# Patient Record
Sex: Female | Born: 1935 | Race: Black or African American | Hispanic: No | Smoking: Never smoker
Health system: Southern US, Community
[De-identification: ages and names within clinical notes are randomized; demographics above are authoritative.]

## PROBLEM LIST (undated history)

## (undated) DIAGNOSIS — I10 Essential (primary) hypertension: Secondary | ICD-10-CM

## (undated) DIAGNOSIS — M1990 Unspecified osteoarthritis, unspecified site: Secondary | ICD-10-CM

## (undated) DIAGNOSIS — I509 Heart failure, unspecified: Secondary | ICD-10-CM

## (undated) HISTORY — PX: TUBAL LIGATION: SHX77

---

## 2015-11-01 ENCOUNTER — Emergency Department (HOSPITAL_COMMUNITY): Payer: Medicare Other

## 2015-11-01 ENCOUNTER — Encounter (HOSPITAL_COMMUNITY): Payer: Self-pay

## 2015-11-01 ENCOUNTER — Emergency Department (HOSPITAL_COMMUNITY)
Admission: EM | Admit: 2015-11-01 | Discharge: 2015-11-01 | Disposition: A | Discharge status: Home / Self Care | Payer: Medicare Other | Attending: Emergency Medicine | Admitting: Emergency Medicine

## 2015-11-01 DIAGNOSIS — Z791 Long term (current) use of non-steroidal anti-inflammatories (NSAID): Secondary | ICD-10-CM | POA: Diagnosis not present

## 2015-11-01 DIAGNOSIS — Y9289 Other specified places as the place of occurrence of the external cause: Secondary | ICD-10-CM | POA: Insufficient documentation

## 2015-11-01 DIAGNOSIS — M1000 Idiopathic gout, unspecified site: Secondary | ICD-10-CM | POA: Diagnosis not present

## 2015-11-01 DIAGNOSIS — Y9389 Activity, other specified: Secondary | ICD-10-CM | POA: Insufficient documentation

## 2015-11-01 DIAGNOSIS — Z880 Allergy status to penicillin: Secondary | ICD-10-CM | POA: Insufficient documentation

## 2015-11-01 DIAGNOSIS — I10 Essential (primary) hypertension: Secondary | ICD-10-CM | POA: Insufficient documentation

## 2015-11-01 DIAGNOSIS — IMO0002 Reserved for concepts with insufficient information to code with codable children: Secondary | ICD-10-CM

## 2015-11-01 DIAGNOSIS — Y998 Other external cause status: Secondary | ICD-10-CM | POA: Diagnosis not present

## 2015-11-01 DIAGNOSIS — Z79899 Other long term (current) drug therapy: Secondary | ICD-10-CM | POA: Insufficient documentation

## 2015-11-01 DIAGNOSIS — M1990 Unspecified osteoarthritis, unspecified site: Secondary | ICD-10-CM | POA: Diagnosis not present

## 2015-11-01 DIAGNOSIS — R229 Localized swelling, mass and lump, unspecified: Secondary | ICD-10-CM

## 2015-11-01 DIAGNOSIS — S99911A Unspecified injury of right ankle, initial encounter: Secondary | ICD-10-CM | POA: Diagnosis present

## 2015-11-01 DIAGNOSIS — W1839XA Other fall on same level, initial encounter: Secondary | ICD-10-CM | POA: Insufficient documentation

## 2015-11-01 DIAGNOSIS — I509 Heart failure, unspecified: Secondary | ICD-10-CM | POA: Diagnosis not present

## 2015-11-01 DIAGNOSIS — R079 Chest pain, unspecified: Secondary | ICD-10-CM | POA: Insufficient documentation

## 2015-11-01 HISTORY — DX: Essential (primary) hypertension: I10

## 2015-11-01 HISTORY — DX: Heart failure, unspecified: I50.9

## 2015-11-01 HISTORY — DX: Unspecified osteoarthritis, unspecified site: M19.90

## 2015-11-01 LAB — COMPREHENSIVE METABOLIC PANEL
ALBUMIN: 3.6 g/dL (ref 3.5–5.0)
ALK PHOS: 88 U/L (ref 38–126)
ALT: 18 U/L (ref 14–54)
ANION GAP: 11 (ref 5–15)
AST: 21 U/L (ref 15–41)
BILIRUBIN TOTAL: 2 mg/dL — AB (ref 0.3–1.2)
BUN: 26 mg/dL — AB (ref 6–20)
CALCIUM: 9.4 mg/dL (ref 8.9–10.3)
CO2: 26 mmol/L (ref 22–32)
CREATININE: 1.43 mg/dL — AB (ref 0.44–1.00)
Chloride: 100 mmol/L — ABNORMAL LOW (ref 101–111)
GFR calc Af Amer: 39 mL/min — ABNORMAL LOW (ref >60)
GFR calc non Af Amer: 34 mL/min — ABNORMAL LOW (ref >60)
GLUCOSE: 129 mg/dL — AB (ref 65–99)
Potassium: 4.2 mmol/L (ref 3.5–5.1)
Sodium: 137 mmol/L (ref 135–145)
TOTAL PROTEIN: 8.3 g/dL — AB (ref 6.5–8.1)

## 2015-11-01 LAB — CBC WITH DIFFERENTIAL/PLATELET
BASOS PCT: 0 %
Basophils Absolute: 0 10*3/uL (ref 0.0–0.1)
Eosinophils Absolute: 0.3 10*3/uL (ref 0.0–0.7)
Eosinophils Relative: 3 %
HEMATOCRIT: 34.8 % — AB (ref 36.0–46.0)
HEMOGLOBIN: 11.3 g/dL — AB (ref 12.0–15.0)
LYMPHS ABS: 1.5 10*3/uL (ref 0.7–4.0)
Lymphocytes Relative: 16 %
MCH: 26.4 pg (ref 26.0–34.0)
MCHC: 32.5 g/dL (ref 30.0–36.0)
MCV: 81.3 fL (ref 78.0–100.0)
MONOS PCT: 11 %
Monocytes Absolute: 1 10*3/uL (ref 0.1–1.0)
NEUTROS ABS: 6.7 10*3/uL (ref 1.7–7.7)
NEUTROS PCT: 70 %
Platelets: 251 10*3/uL (ref 150–400)
RBC: 4.28 MIL/uL (ref 3.87–5.11)
RDW: 13.8 % (ref 11.5–15.5)
WBC: 9.5 10*3/uL (ref 4.0–10.5)

## 2015-11-01 LAB — I-STAT TROPONIN, ED: Troponin i, poc: 0.02 ng/mL (ref 0.00–0.08)

## 2015-11-01 LAB — URIC ACID: Uric Acid, Serum: 7.5 mg/dL — ABNORMAL HIGH (ref 2.3–6.6)

## 2015-11-01 MED ORDER — ONDANSETRON HCL 4 MG/2ML IJ SOLN
4.0000 mg | Freq: Once | INTRAMUSCULAR | Status: AC
Start: 2015-11-01 — End: 2015-11-01
  Administered 2015-11-01: 4 mg via INTRAVENOUS
  Filled 2015-11-01: qty 2

## 2015-11-01 MED ORDER — NAPROXEN 500 MG PO TABS: 500.0000 mg | ORAL_TABLET | Freq: Two times a day (BID) | ORAL | Status: AC

## 2015-11-01 MED ORDER — HYDROMORPHONE HCL 1 MG/ML IJ SOLN
1.0000 mg | Freq: Once | INTRAMUSCULAR | Status: AC
  Administered 2015-11-01: 1 mg via INTRAVENOUS
  Filled 2015-11-01: qty 1

## 2015-11-01 NOTE — ED Notes (Signed)
Per [facility] -Pt resides in [place]. Pt fell in [place] on [place] in 9 days ago. Seen and TX DX with sprain. Continued with pain and swelling. Seen at Urgent care last Saturday for unresolved complaints. Given RX for pain, swelling and NSAIDS. Pt here for re evaluation for the continued pain and swelling.

## 2015-11-01 NOTE — ED Notes (Signed)
Bed: ZO10 Expected date:  Expected time:  Means of arrival:  Comments: EMS- bilateral leg pain

## 2015-11-01 NOTE — ED Notes (Signed)
Patient transported to X-ray 

## 2015-11-01 NOTE — ED Notes (Signed)
MD at bedside. 

## 2015-11-01 NOTE — Progress Notes (Addendum)
   11/01/15 0000  CM Assessment  Expected Discharge Plan Home w Home Health Services  In-house Referral NA  Discharge Planning Services CM Consult  [facility]  Choice offered to / list presented to  Patient  DME Arranged Walker rolling with seat  HH Arranged RN;PT  Status of Service Completed, signed off  Discharge Disposition Home w Home Health Services    ED Cm consulted by EDP, Zammit to assess pt for needs for home care CM spoke with pt and daughter at bedside This daughter lives in [place] Pt son is in [place] and receiving dialysis since [place] in September 2016 Pt began to have difficulties with her leg while walking [facility] [place] [facility] while assisting in her son's care Pt reports having to sleep in a recliner at her son's home Pt reports having DME of a boot for her leg and a cane only Pt voiced interest in home therapy  No pcp in [place] nor in [place] nor [place]  CM reviewed in details medicare guidelines, home health (HH) (length of stay in home, types of [facility] staff available, coverage, primary caregiver, up to 24 hrs before services may be started) and Private duty nursing (PDN-coverage, length of stay in the home types of staff available).  CM provided pt/daughter with a list of [place] home health agencies and PDN.  Updated EDP and orders entered in EPIC for [facility], PT and rollator.

## 2015-11-01 NOTE — Progress Notes (Signed)
Pt will be staying with her daughter at [address] [place] [address] [phone] from 11/01/15 to 11/05/15 and will return to [place] home address Ht 5'7" wt 250 pounds

## 2015-11-01 NOTE — ED Provider Notes (Signed)
CSN: [id number]     Arrival date & time 11/01/15  1504 History   First MD Initiated Contact with Patient 11/01/15 1521     Chief Complaint  Patient presents with  . Leg Swelling  . Extremity Weakness  . Leg Pain     (Consider location/radiation/quality/duration/timing/severity/associated sxs/prior Treatment) Patient is a 82 y.o. female presenting with leg pain. The history is provided by the patient (The patient complains of pain in both ankles and feet. She fell recently twisted her right ankle did not her left ankle. She is also complained of some chest tightness yesterday but none today).  Leg Pain Lower extremity pain location: Bilateral feet pain. Pain details:    Quality:  Aching   Radiates to:  Does not radiate   Severity:  Moderate   Onset quality:  Gradual   Timing:  Constant   Progression:  Worsening Chronicity:  Recurrent Associated symptoms: no back pain and no fatigue     Past Medical History  Diagnosis Date  . CHF (congestive heart failure) (HCC)   . Hypertension   . Arthritis    Past Surgical History  Procedure Laterality Date  . Tubal ligation     No family history on file. Social History  Substance Use Topics  . Smoking status: Never Smoker   . Smokeless tobacco: None  . Alcohol Use: No   OB History    No data available     Review of Systems  Constitutional: Negative for appetite change and fatigue.  HENT: Negative for congestion, ear discharge and sinus pressure.   Eyes: Negative for discharge.  Respiratory: Negative for cough.   Cardiovascular: Positive for chest pain.  Gastrointestinal: Negative for abdominal pain and diarrhea.  Genitourinary: Negative for frequency and hematuria.  Musculoskeletal: Negative for back pain.       Pain in both feet  Skin: Negative for rash.  Neurological: Negative for seizures and headaches.  Psychiatric/Behavioral: Negative for hallucinations.      Allergies  Iodine; Iohexol; and Penicillins  Home  Medications   Prior to Admission medications   Medication Sig Start Date End Date Taking? Authorizing Provider  acetaminophen (TYLENOL) 500 MG tablet Take 1,000 mg by mouth 3 (three) times daily.   Yes Historical Provider, MD  diclofenac sodium (VOLTAREN) 1 % GEL Apply 2 g topically 2 (two) times daily.   Yes Historical Provider, MD  naproxen (NAPROSYN) 500 MG tablet Take 1 tablet (500 mg total) by mouth 2 (two) times daily. 11/01/15   Bethann Berkshire, MD  oxycodone (OXY-IR) 5 MG capsule Take 5-10 mg by mouth every 6 (six) hours as needed for pain.   Yes Historical Provider, MD   BP 138/70 mmHg  Pulse 84  Temp(Src) 98.3 F (36.8 C) (Oral)  Resp 19  Ht  (1.702 m)  Wt 248 lb (112.492 kg)  BMI 38.83 kg/m2  SpO2 100% Physical Exam  Constitutional: She is oriented to person, place, and time. She appears well-developed.  HENT:  Head: Normocephalic.  Eyes: Conjunctivae and EOM are normal. No scleral icterus.  Neck: Neck supple. No thyromegaly present.  Cardiovascular: Normal rate and regular rhythm.  Exam reveals no gallop and no friction rub.   No murmur heard. Pulmonary/Chest: No stridor. She has no wheezes. She has no rales. She exhibits no tenderness.  Abdominal: She exhibits no distension. There is no tenderness. There is no rebound.  Musculoskeletal: Normal range of motion. She exhibits no edema.  Tenderness and swelling to left foot moderate.  Very minimal tenderness to right ankle  Lymphadenopathy:    She has no cervical adenopathy.  Neurological: She is oriented to person, place, and time. She exhibits normal muscle tone. Coordination normal.  Skin: No rash noted. No erythema.  Psychiatric: She has a normal mood and affect. Her behavior is normal.    ED Course  Procedures (including critical care time) Labs Review Labs Reviewed  CBC WITH DIFFERENTIAL/PLATELET - Abnormal; Notable for the following:    Hemoglobin 11.3 (*)    HCT 34.8 (*)    All other components within  normal limits  COMPREHENSIVE METABOLIC PANEL - Abnormal; Notable for the following:    Chloride 100 (*)    Glucose, Bld 129 (*)    BUN 26 (*)    Creatinine, Ser 1.43 (*)    Total Protein 8.3 (*)    Total Bilirubin 2.0 (*)    GFR calc non Af Amer 34 (*)    GFR calc Af Amer 39 (*)    All other components within normal limits  URIC ACID - Abnormal; Notable for the following:    Uric Acid, Serum 7.5 (*)    All other components within normal limits  Rosezena Sensor, ED    Imaging Review Dg Chest 2 View  11/01/2015  CLINICAL DATA:  Fever. Chest discomfort starting last night. Congestive heart failure. EXAM: CHEST  2 VIEW COMPARISON:  None. FINDINGS: Low lung volumes are present, causing crowding of the pulmonary vasculature. Mild to moderate enlargement of the cardiopericardial silhouette noted with tortuosity of the thoracic aorta which likely causes rightward deviation of the trachea. Density medially at the right lung apex is nonspecific but might be vascular. Mild bandlike density at the right lung base and in the left mid lung, likely from scarring or atelectasis. No pleural effusion identified. IMPRESSION: 1. Low lung volumes are present, causing crowding of the pulmonary vasculature. 2. Mild to moderate enlargement of the cardiopericardial silhouette. 3. Primarily linear but indistinct density at the right lung base favors atelectasis although could conceivably represent early bronchopneumonia. 4. Linear subsegmental atelectasis or scarring in the left mid lung. 5. There is density medially at the right lung apex. Given the rib rightward tracheal shift itself attributable to aortic tortuosity, this might be due to ectatic branch vasculature. Strictly speaking and without the benefit of prior examinations for comparison, an apical neoplastic pulmonary nodule could have a similar appearance. If further workup is warranted, chest CT would be recommended. Electronically Signed   By: Gaylyn Rong M.D.   On: 11/01/2015 16:53   Dg Ankle Complete Left  11/01/2015  CLINICAL DATA:  LEFT ankle pain and swelling for 1 week EXAM: LEFT ANKLE COMPLETE - 3+ VIEW COMPARISON:  None. FINDINGS: Ankle mortise intact. The talar dome is normal. No malleolar fracture. The calcaneus is normal. Small joint effusion anterior to the tibiotalar joint. IMPRESSION: No fracture or dislocation.  Small anterior joint effusion. Electronically Signed   By: Genevive Bi M.D.   On: 11/01/2015 16:01   Ct Chest Wo Contrast  11/01/2015  CLINICAL DATA:  Abnormal chest x-ray, shortness of breath. EXAM: CT CHEST WITHOUT CONTRAST TECHNIQUE: Multidetector CT imaging of the chest was performed following the standard protocol without IV contrast. COMPARISON:  Chest radiograph of same day. FINDINGS: No pneumothorax or pleural effusion is noted. Calcified granuloma is noted in lingular segment of left upper lobe. 4 mm nodule is noted laterally in left upper lobe best seen on image number 22 of series 7.  Bronchiectasis and scarring is noted medially in the right lung base posteriorly. 5 mm nodule is noted laterally in the right upper lobe best seen on image number 31 of series 7. Mild cardiomegaly is noted. No definite mass is noted in the mediastinum. Abnormality seen in right lung apex on prior exam is consistent with tortuous right innominate artery. Atherosclerosis of thoracic aorta is noted without aneurysm. Visualized portion of upper abdomen is unremarkable. No significant osseous abnormality is noted. IMPRESSION: No large mass is noted. Abnormality seen in right lung apex on prior exam represents tortuous right innominate artery. Bilateral pulmonary nodules are noted, with the largest measuring 5 mm in the right upper lobe. If the patient is at high risk for bronchogenic carcinoma, follow-up chest CT at 6-12 months is recommended. If the patient is at low risk for bronchogenic carcinoma, follow-up chest CT at 12 months is  recommended. This recommendation follows the consensus statement: Guidelines for Management of Small Pulmonary Nodules Detected on CT Scans: A Statement from the Fleischner Society as published in Radiology 2005;237:395-400. Electronically Signed   By: Lupita Raider, M.D.   On: 11/01/2015 18:07   I have personally reviewed and evaluated these images and lab results as part of my medical decision-making.   EKG Interpretation None      MDM   Final diagnoses:  Acute idiopathic gout, unspecified site    Suspect patient has Left foot sprained ankle and right ankle home health was been arranged she was put on Naprosyn she's not take her pain medicine which she had not been taking cardiac workup was negative for any acute coronary syndrome    Bethann Berkshire, MD 11/01/15 [phone]

## 2015-11-01 NOTE — Progress Notes (Signed)
pcp is Dr Wendi Maya [facility] internal medicine [address] [place] [address] [phone]

## 2015-11-01 NOTE — Discharge Instructions (Signed)
Gout Gout is when your joints become red, sore, and swell (inflamed). This is caused by the buildup of uric acid crystals in the joints. Uric acid is a chemical that is normally in the blood. If the level of uric acid gets too high in the blood, these crystals form in your joints and tissues. Over time, these crystals can form into masses near the joints and tissues. These masses can destroy bone and cause the bone to look misshapen (deformed). HOME CARE   Do not take aspirin for pain.  Only take medicine as told by your doctor.  Rest the joint as much as you can. When in bed, keep sheets and blankets off painful areas.  Keep the sore joints raised (elevated).  Put warm or cold packs on painful joints. Use of warm or cold packs depends on which works best for you.  Use crutches if the painful joint is in your leg.  Drink enough fluids to keep your pee (urine) clear or pale yellow. Limit alcohol, sugary drinks, and drinks with fructose in them.  Follow your diet instructions. Pay careful attention to how much protein you eat. Include fruits, vegetables, whole grains, and fat-free or low-fat milk products in your daily diet. Talk to your doctor or dietitian about the use of coffee, vitamin C, and cherries. These may help lower uric acid levels.  Keep a healthy body weight. GET HELP RIGHT AWAY IF:   You have watery poop (diarrhea), throw up (vomit), or have any side effects from medicines.  You do not feel better in 24 hours, or you are getting worse.  Your joint becomes suddenly more tender, and you have chills or a fever. MAKE SURE YOU:   Understand these instructions.  Will watch your condition.  Will get help right away if you are not doing well or get worse.   This information is not intended to replace advice given to you by your health care provider. Make sure you discuss any questions you have with your health care provider.   Document Released: 06/18/2008 Document Revised:  09/30/2014 Document Reviewed: 04/22/2012 Elsevier Interactive Patient Education [employer] Follow up in 1-2 weeks for recheck

## 2015-11-01 NOTE — Progress Notes (Signed)
Entered in d/c instructions Dr Wendi Maya Schedule an appointment as soon as possible for a visit in 2 weeks [facility] internal medicine  [address]  [place] [address]  [phone] [place] Your information for services for a rollator, a nurse and physical therapy has been provided to an in house representative for [facility]) Your daughter phone will be called to initiate services but you may call as needed [address] [place] [address] [phone] [facility] Your information for services for a rollator, a nurse and physical therapy has been provided to an in house representative for [facility]) Your daughter phone will be called to initiate services but you may call as needed [address] [place], [place] [address] [phone] [place] case manager Kim Call As needed-- There is a possibility your rollator may come from advanced home care and the closest one to you is [place], [place]........... [phone] office # [phone]

## 2015-11-01 NOTE — Progress Notes (Signed)
Attempt to call pcp but office is closed and only allowing calls for the on call MD to be left will attempt call 11/02/15 \\PCP  is not accessible via EPIC

## 2015-11-02 ENCOUNTER — Telehealth: Payer: Self-pay | Admitting: *Deleted

## 2015-11-03 ENCOUNTER — Telehealth: Payer: Self-pay | Admitting: *Deleted

## 2016-12-13 IMAGING — CR DG ANKLE COMPLETE 3+V*L*
3 series · 3 of 3 positions shown · non-contrast
Comparison: None.

CLINICAL DATA: LEFT ankle pain and swelling for 1 week

EXAM:
LEFT ANKLE COMPLETE - 3+ VIEW

[x ankle ap left]
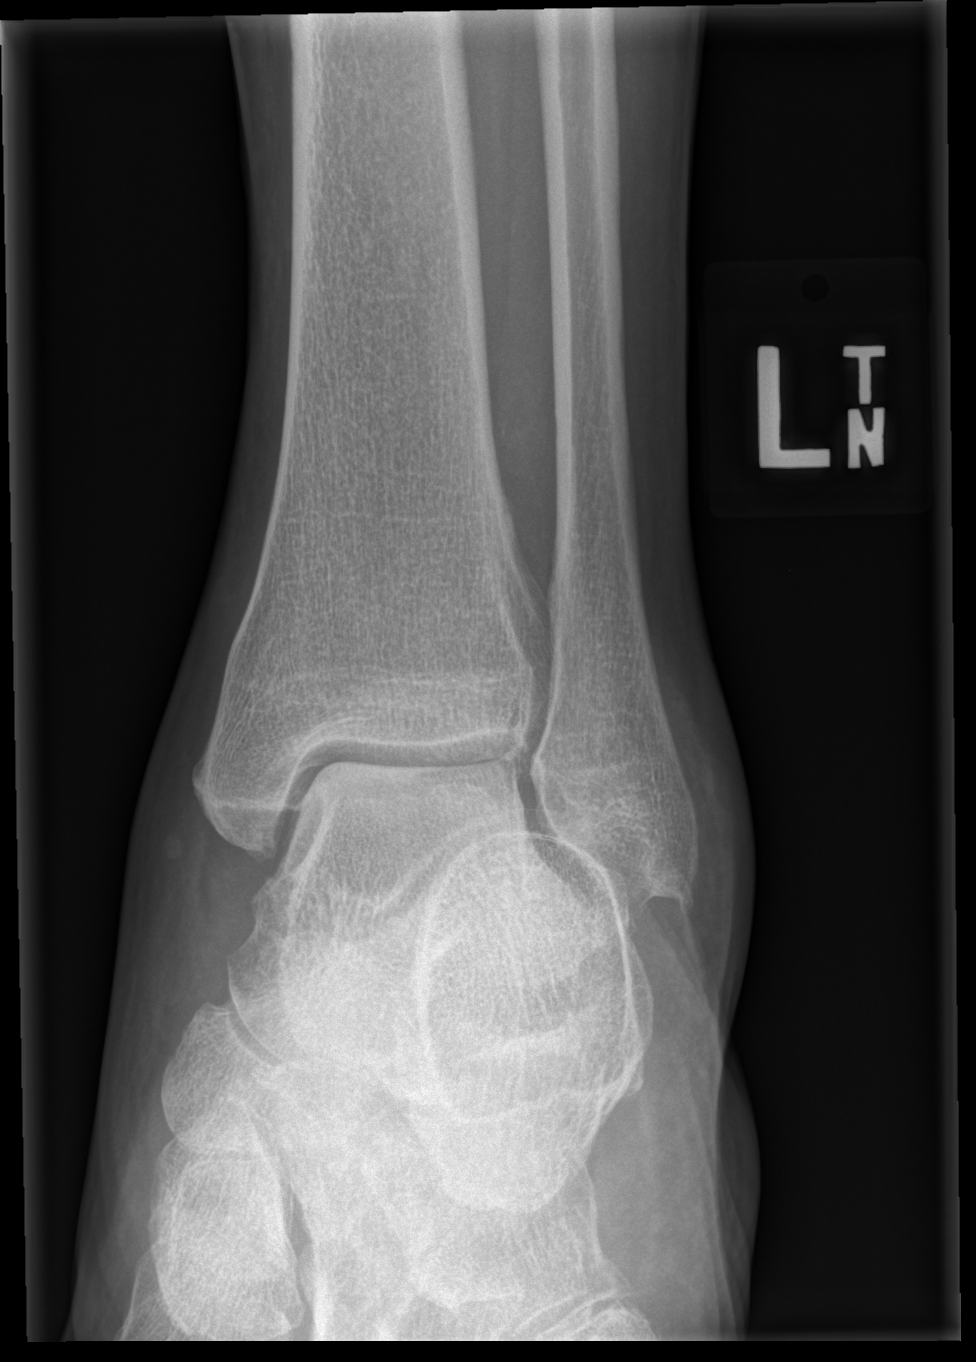

[x ankle obl left]
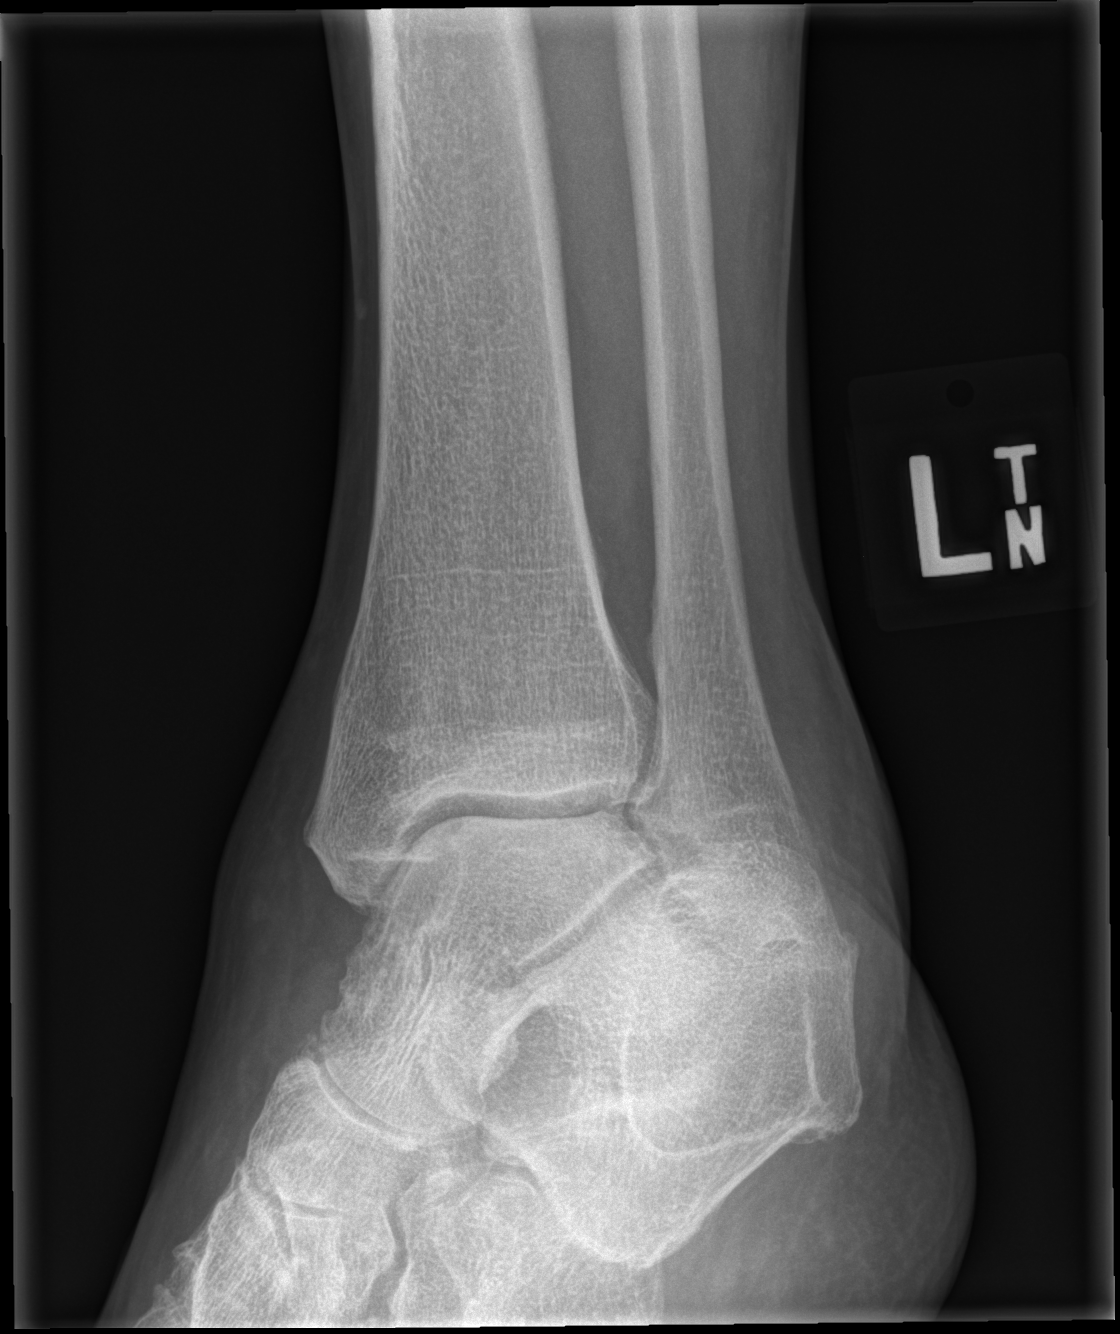

[x ankle lat left]
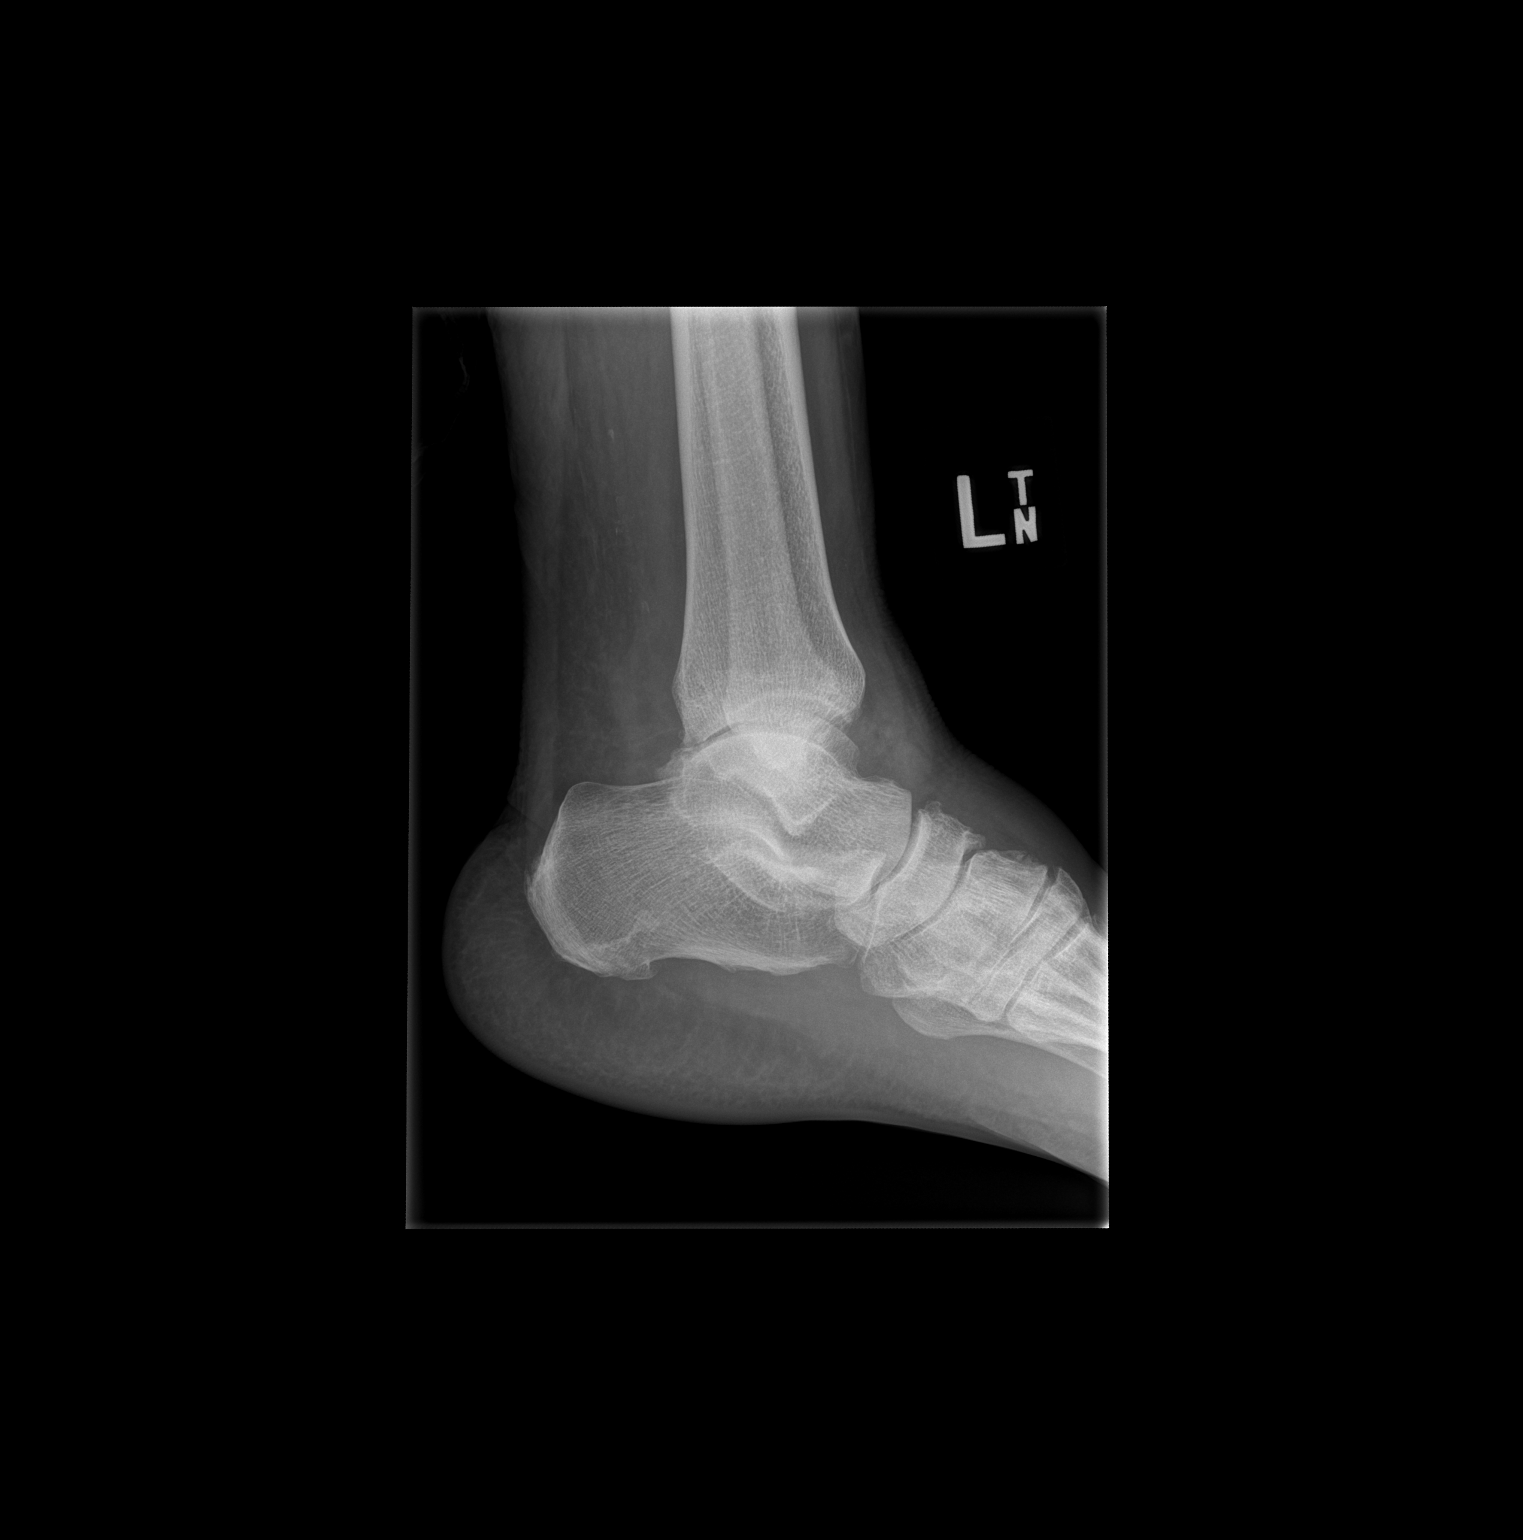

[3 of 3 positions shown; findings below may reference images not displayed]

FINDINGS: Ankle mortise intact. The talar dome is normal. No malleolar
fracture. The calcaneus is normal. Small joint effusion anterior to
the tibiotalar joint.
IMPRESSION: No fracture or dislocation.  Small anterior joint effusion.

## 2022-05-24 DEATH — deceased
